# Patient Record
Sex: Female | Born: 1975 | Race: White | Hispanic: No | Smoking: Current every day smoker
Health system: Southern US, Community
[De-identification: ages and names within clinical notes are randomized; demographics above are authoritative.]

## PROBLEM LIST (undated history)

## (undated) DIAGNOSIS — R51 Headache: Secondary | ICD-10-CM

---

## 2002-07-29 ENCOUNTER — Emergency Department (HOSPITAL_COMMUNITY): Admission: EM | Admit: 2002-07-29 | Discharge: 2002-07-29 | Payer: Self-pay | Admitting: Unknown Physician Specialty

## 2002-07-29 ENCOUNTER — Encounter: Payer: Self-pay | Admitting: Emergency Medicine

## 2006-05-06 ENCOUNTER — Other Ambulatory Visit: Admission: RE | Admit: 2006-05-06 | Discharge: 2006-05-06 | Payer: Self-pay | Admitting: Family Medicine

## 2012-08-19 ENCOUNTER — Emergency Department (HOSPITAL_COMMUNITY)
Admission: EM | Admit: 2012-08-19 | Discharge: 2012-08-19 | Disposition: A | Discharge status: Home / Self Care | Payer: Medicaid Other | Attending: Emergency Medicine | Admitting: Emergency Medicine

## 2012-08-19 ENCOUNTER — Encounter (HOSPITAL_COMMUNITY): Payer: Self-pay | Admitting: *Deleted

## 2012-08-19 DIAGNOSIS — F17200 Nicotine dependence, unspecified, uncomplicated: Secondary | ICD-10-CM | POA: Insufficient documentation

## 2012-08-19 DIAGNOSIS — A088 Other specified intestinal infections: Secondary | ICD-10-CM | POA: Insufficient documentation

## 2012-08-19 DIAGNOSIS — R1110 Vomiting, unspecified: Secondary | ICD-10-CM | POA: Insufficient documentation

## 2012-08-19 DIAGNOSIS — Z3202 Encounter for pregnancy test, result negative: Secondary | ICD-10-CM | POA: Insufficient documentation

## 2012-08-19 DIAGNOSIS — IMO0002 Reserved for concepts with insufficient information to code with codable children: Secondary | ICD-10-CM | POA: Insufficient documentation

## 2012-08-19 DIAGNOSIS — R197 Diarrhea, unspecified: Secondary | ICD-10-CM | POA: Insufficient documentation

## 2012-08-19 DIAGNOSIS — Z79899 Other long term (current) drug therapy: Secondary | ICD-10-CM | POA: Insufficient documentation

## 2012-08-19 DIAGNOSIS — R1084 Generalized abdominal pain: Secondary | ICD-10-CM | POA: Insufficient documentation

## 2012-08-19 DIAGNOSIS — K529 Noninfective gastroenteritis and colitis, unspecified: Secondary | ICD-10-CM

## 2012-08-19 LAB — COMPREHENSIVE METABOLIC PANEL
ALT: 32 U/L (ref 0–35)
AST: 26 U/L (ref 0–37)
Albumin: 4.3 g/dL (ref 3.5–5.2)
Alkaline Phosphatase: 85 U/L (ref 39–117)
Calcium: 9.6 mg/dL (ref 8.4–10.5)
GFR calc Af Amer: >90 mL/min (ref >90)
Glucose, Bld: 114 mg/dL — ABNORMAL HIGH (ref 70–99)
Potassium: 4.2 mEq/L (ref 3.5–5.1)
Sodium: 137 mEq/L (ref 135–145)
Total Protein: 7.5 g/dL (ref 6.0–8.3)

## 2012-08-19 LAB — CBC
MCH: 30.6 pg (ref 26.0–34.0)
MCHC: 33.4 g/dL (ref 30.0–36.0)
Platelets: 437 10*3/uL — ABNORMAL HIGH (ref 150–400)
RDW: 13.3 % (ref 11.5–15.5)

## 2012-08-19 LAB — URINALYSIS, ROUTINE W REFLEX MICROSCOPIC
Bilirubin Urine: NEGATIVE
Glucose, UA: NEGATIVE mg/dL
Nitrite: NEGATIVE
Specific Gravity, Urine: 1.01 (ref 1.005–1.030)
pH: 7 (ref 5.0–8.0)

## 2012-08-19 LAB — URINE MICROSCOPIC-ADD ON

## 2012-08-19 MED ORDER — ONDANSETRON HCL 4 MG/2ML IJ SOLN
4.0000 mg | Freq: Once | INTRAMUSCULAR | Status: AC
  Administered 2012-08-19: 4 mg via INTRAVENOUS
  Filled 2012-08-19: qty 2

## 2012-08-19 MED ORDER — SODIUM CHLORIDE 0.9 % IV SOLN
Freq: Once | INTRAVENOUS | Status: AC
  Administered 2012-08-19: 21:00:00 via INTRAVENOUS

## 2012-08-19 MED ORDER — ONDANSETRON HCL 8 MG PO TABS: 8.0000 mg | ORAL_TABLET | Freq: Three times a day (TID) | ORAL | Status: DC | PRN

## 2012-08-19 MED ORDER — HYDROCODONE-ACETAMINOPHEN 5-325 MG PO TABS: 2.0000 | ORAL_TABLET | ORAL | Status: DC | PRN

## 2012-08-19 MED ORDER — HYDROMORPHONE HCL PF 1 MG/ML IJ SOLN
1.0000 mg | Freq: Once | INTRAMUSCULAR | Status: AC
  Administered 2012-08-19: 1 mg via INTRAVENOUS
  Filled 2012-08-19: qty 1

## 2012-08-19 MED ORDER — SODIUM CHLORIDE 0.9 % IV BOLUS (SEPSIS)
2000.0000 mL | Freq: Once | INTRAVENOUS | Status: AC
  Administered 2012-08-19: 1000 mL via INTRAVENOUS

## 2012-08-19 MED ORDER — ACETAMINOPHEN 325 MG PO TABS
650.0000 mg | ORAL_TABLET | Freq: Once | ORAL | Status: AC
  Administered 2012-08-19: 650 mg via ORAL
  Filled 2012-08-19: qty 2

## 2012-08-19 NOTE — ED Notes (Addendum)
Vomiting, fever, diarrhea  Took tylenol 1-2 hours pta  And motrin  Headache

## 2012-08-19 NOTE — ED Notes (Signed)
Pt verbalized understanding of need for urine sample to send to lab, states vomiting started this morning at 0500 and diarrhea later today with fever

## 2012-08-21 NOTE — ED Provider Notes (Signed)
History     CSN: [id number]  Arrival date & time 08/19/12  1625   First MD Initiated Contact with Patient 08/19/12 2028      Chief Complaint  Patient presents with  . Fever    (Consider location/radiation/quality/duration/timing/severity/associated sxs/prior treatment) HPI Comments: Jordan Dalton is a 37 y.o. Female who complains of vomiting and diarrhea today. Multiple episodes of each, without blood. She's had fever to 102. She denies headache, neck pain, back pain, weakness, or dizziness. She has crampy abdominal pain. Her pain worsens with attempts at oral intake. She came here by private vehicle. There are no other modifying factors.  Patient is a 37 y.o. female presenting with fever. The history is provided by the patient.  Fever   History reviewed. No pertinent past medical history.  Past Surgical History  Procedure Laterality Date  . Cesarean section      History reviewed. No pertinent family history.  History  Substance Use Topics  . Smoking status: Current Every Day Smoker  . Smokeless tobacco: Not on file  . Alcohol Use: Yes     Comment: rarely    OB History   Grav Para Term Preterm Abortions TAB SAB Ect Mult Living                  Review of Systems  Constitutional: Positive for fever.  All other systems reviewed and are negative.    Allergies  Darvocet and Tramadol  Home Medications   Current Outpatient Rx  Name  Route  Sig  Dispense  Refill  . acetaminophen (TYLENOL) 500 MG tablet   Oral   Take 1,000 mg by mouth daily as needed for pain.         Marland Kitchen ALPRAZolam (XANAX) 1 MG tablet   Oral   Take 1 mg by mouth 3 (three) times daily as needed for sleep or anxiety.         . Aspirin-Acetaminophen-Caffeine (GOODY HEADACHE PO)   Oral   Take 1 Package by mouth daily as needed (FOR PAIN).         . fluticasone (FLONASE) 50 MCG/ACT nasal spray   Nasal   Place 2 sprays into the nose daily.         Marland Kitchen ibuprofen (ADVIL,MOTRIN) 200 MG  tablet   Oral   Take 800 mg by mouth daily as needed for pain.         Marland Kitchen sertraline (ZOLOFT) 50 MG tablet   Oral   Take 50 mg by mouth daily.         Marland Kitchen HYDROcodone-acetaminophen (NORCO) 5-325 MG per tablet   Oral   Take 2 tablets by mouth every 4 (four) hours as needed for pain.   20 tablet   0   . ondansetron (ZOFRAN) 8 MG tablet   Oral   Take 1 tablet (8 mg total) by mouth every 8 (eight) hours as needed for nausea.   20 tablet   0     BP 107/70  Pulse 103  Temp(Src) 100.1 F (37.8 C) (Oral)  Resp 16  Ht 5\' 7"  (1.702 m)  Wt 105 lb (47.628 kg)  BMI 16.44 kg/m2  SpO2 98%  LMP 08/05/2012  Physical Exam  Nursing note and vitals reviewed. Constitutional: She is oriented to person, place, and time. She appears well-developed and well-nourished.  HENT:  Head: Normocephalic and atraumatic.  Eyes: Conjunctivae and EOM are normal. Pupils are equal, round, and reactive to light.  Neck: Normal range  of motion and phonation normal. Neck supple.  Cardiovascular: Normal rate, regular rhythm and intact distal pulses.   Pulmonary/Chest: Effort normal and breath sounds normal. She exhibits no tenderness.  Abdominal: Soft. Bowel sounds are normal. She exhibits no distension. There is tenderness (mild, diffuse). There is no rebound and no guarding.  Musculoskeletal: Normal range of motion.  Neurological: She is alert and oriented to person, place, and time. She has normal strength. She exhibits normal muscle tone.  Skin: Skin is warm and dry.  Psychiatric: She has a normal mood and affect. Her behavior is normal. Judgment and thought content normal.    ED Course  Procedures (including critical care time)   Medications  acetaminophen (TYLENOL) tablet 650 mg (650 mg Oral Given 08/19/12 2039)  0.9 %  sodium chloride infusion ( Intravenous Stopped 08/19/12 2138)  sodium chloride 0.9 % bolus 2,000 mL (0 mLs Intravenous Stopped 08/19/12 2219)  HYDROmorphone (DILAUDID) injection 1 mg  (1 mg Intravenous Given 08/19/12 2202)  ondansetron (ZOFRAN) injection 4 mg (4 mg Intravenous Given 08/19/12 2202)    Evaluation at discharge: She feels better.   Labs Reviewed  CBC - Abnormal; Notable for the following:    WBC 13.3 (*)    Platelets 437 (*)    All other components within normal limits  COMPREHENSIVE METABOLIC PANEL - Abnormal; Notable for the following:    Glucose, Bld 114 (*)    Total Bilirubin 0.2 (*)    All other components within normal limits  URINALYSIS, ROUTINE W REFLEX MICROSCOPIC - Abnormal; Notable for the following:    APPearance HAZY (*)    Hgb urine dipstick TRACE (*)    All other components within normal limits  URINE MICROSCOPIC-ADD ON - Abnormal; Notable for the following:    Squamous Epithelial / LPF FEW (*)    All other components within normal limits  PREGNANCY, URINE    Nursing Notes Reviewed/ Care Coordinated, and agree without changes. Applicable Imaging Reviewed Interpretation of Laboratory Data incorporated into ED treatment   1. Gastroenteritis       MDM  Evaluation consistent with simple viral gastroenteritis, epidemic. Doubt metabolic instability, serious bacterial infection or impending vascular collapse; the patient is stable for discharge.     Plan: Home Medications-  Discharge Medication List as of 08/19/2012 10:09 PM    START taking these medications   Details  HYDROcodone-acetaminophen (NORCO) 5-325 MG per tablet Take 2 tablets by mouth every 4 (four) hours as needed for pain., Starting 08/19/2012, Until Discontinued, Print    ondansetron (ZOFRAN) 8 MG tablet Take 1 tablet (8 mg total) by mouth every 8 (eight) hours as needed for nausea., Starting 08/19/2012, Until Discontinued, Print      ; Home Treatments- generally advanced diet; Recommended follow up- PCP, when necessary      Flint Melter, MD 08/21/12 [phone]

## 2012-12-05 ENCOUNTER — Emergency Department (HOSPITAL_COMMUNITY)
Admission: EM | Admit: 2012-12-05 | Discharge: 2012-12-05 | Discharge status: Against Medical Advice | Payer: Self-pay | Attending: Emergency Medicine | Admitting: Emergency Medicine

## 2012-12-05 ENCOUNTER — Encounter (HOSPITAL_COMMUNITY): Payer: Self-pay | Admitting: *Deleted

## 2012-12-05 DIAGNOSIS — F17200 Nicotine dependence, unspecified, uncomplicated: Secondary | ICD-10-CM | POA: Insufficient documentation

## 2012-12-05 DIAGNOSIS — Z3202 Encounter for pregnancy test, result negative: Secondary | ICD-10-CM | POA: Insufficient documentation

## 2012-12-05 DIAGNOSIS — N939 Abnormal uterine and vaginal bleeding, unspecified: Secondary | ICD-10-CM | POA: Insufficient documentation

## 2012-12-05 DIAGNOSIS — R1032 Left lower quadrant pain: Secondary | ICD-10-CM | POA: Insufficient documentation

## 2012-12-05 DIAGNOSIS — N926 Irregular menstruation, unspecified: Secondary | ICD-10-CM | POA: Insufficient documentation

## 2012-12-05 LAB — URINALYSIS, ROUTINE W REFLEX MICROSCOPIC
Bilirubin Urine: NEGATIVE
Hgb urine dipstick: NEGATIVE
Nitrite: NEGATIVE
Protein, ur: NEGATIVE mg/dL
Urobilinogen, UA: 0.2 mg/dL (ref 0.0–1.0)

## 2012-12-05 NOTE — ED Notes (Signed)
Abnormal vaginal bleeding x 4 wks with LLQ pain.  Took 3 home pregnancy tests between May 15 and June 1 with positive results.  Took home pregnancy test 2 wks ago with negative result.  Denies n/v/d.

## 2012-12-05 NOTE — ED Notes (Signed)
Pt called to treatment area twice. No response. Pt not in lobby

## 2012-12-17 ENCOUNTER — Telehealth: Payer: Self-pay | Admitting: *Deleted

## 2012-12-31 NOTE — Telephone Encounter (Signed)
Left message x 2. JSY 

## 2013-05-27 ENCOUNTER — Other Ambulatory Visit: Payer: Self-pay | Admitting: Obstetrics & Gynecology

## 2013-05-27 DIAGNOSIS — O3680X0 Pregnancy with inconclusive fetal viability, not applicable or unspecified: Secondary | ICD-10-CM

## 2013-05-28 NOTE — L&D Delivery Note (Signed)
Pt was admitted for a TOLAC last Pm in early labor. After admission she had an epidural. She completed the first stage with occ variable decels. She had a 7 min decel with pushing. She continued to have deep decels and the forceps were placed in the LOA position at +2  Station to shorten the second stage. She delivered one live viable white female with the first contraction. She had thick mec. Placenta-Manually removed. She had a 2nd degree midline tear and bilateral labial tears closed with 3-0 chromic. EBL-400cc. Baby to NBN. NICU was present.

## 2013-05-29 ENCOUNTER — Other Ambulatory Visit: Payer: Self-pay

## 2013-05-29 ENCOUNTER — Encounter: Payer: Self-pay | Admitting: *Deleted

## 2013-06-29 LAB — OB RESULTS CONSOLE ABO/RH: RH Type: POSITIVE

## 2013-06-29 LAB — OB RESULTS CONSOLE GC/CHLAMYDIA
Chlamydia: NEGATIVE
GC PROBE AMP, GENITAL: NEGATIVE

## 2013-06-29 LAB — OB RESULTS CONSOLE RPR: RPR: NONREACTIVE

## 2013-06-29 LAB — OB RESULTS CONSOLE ANTIBODY SCREEN: Antibody Screen: NEGATIVE

## 2013-06-29 LAB — OB RESULTS CONSOLE HIV ANTIBODY (ROUTINE TESTING): HIV: NONREACTIVE

## 2013-06-29 LAB — OB RESULTS CONSOLE HEPATITIS B SURFACE ANTIGEN: Hepatitis B Surface Ag: NEGATIVE

## 2013-06-29 LAB — OB RESULTS CONSOLE RUBELLA ANTIBODY, IGM: Rubella: IMMUNE

## 2013-09-12 LAB — OB RESULTS CONSOLE GBS: STREP GROUP B AG: NEGATIVE

## 2013-09-15 ENCOUNTER — Other Ambulatory Visit: Payer: Self-pay

## 2013-09-15 ENCOUNTER — Other Ambulatory Visit (HOSPITAL_COMMUNITY): Payer: Self-pay | Admitting: Obstetrics and Gynecology

## 2013-09-15 ENCOUNTER — Other Ambulatory Visit: Payer: Self-pay | Admitting: Obstetrics & Gynecology

## 2013-09-15 DIAGNOSIS — IMO0002 Reserved for concepts with insufficient information to code with codable children: Secondary | ICD-10-CM

## 2013-09-15 DIAGNOSIS — O3680X0 Pregnancy with inconclusive fetal viability, not applicable or unspecified: Secondary | ICD-10-CM

## 2013-09-17 ENCOUNTER — Ambulatory Visit (HOSPITAL_COMMUNITY)
Admission: RE | Admit: 2013-09-17 | Discharge: 2013-09-17 | Disposition: A | Discharge status: Home / Self Care | Payer: Medicaid Other | Source: Ambulatory Visit | Attending: Obstetrics and Gynecology | Admitting: Obstetrics and Gynecology

## 2013-09-17 ENCOUNTER — Encounter (HOSPITAL_COMMUNITY): Payer: Self-pay

## 2013-09-17 DIAGNOSIS — O365990 Maternal care for other known or suspected poor fetal growth, unspecified trimester, not applicable or unspecified: Secondary | ICD-10-CM | POA: Insufficient documentation

## 2013-09-17 DIAGNOSIS — IMO0002 Reserved for concepts with insufficient information to code with codable children: Secondary | ICD-10-CM

## 2013-09-17 DIAGNOSIS — Z3689 Encounter for other specified antenatal screening: Secondary | ICD-10-CM | POA: Insufficient documentation

## 2013-09-21 ENCOUNTER — Encounter (HOSPITAL_COMMUNITY): Payer: Self-pay | Admitting: Pharmacist

## 2013-09-26 ENCOUNTER — Inpatient Hospital Stay (HOSPITAL_COMMUNITY)
Admission: AD | Admit: 2013-09-26 | Discharge: 2013-09-26 | Disposition: A | Discharge status: Home / Self Care | Payer: Medicaid Other | Source: Ambulatory Visit | Attending: Obstetrics and Gynecology | Admitting: Obstetrics and Gynecology

## 2013-09-26 ENCOUNTER — Inpatient Hospital Stay (HOSPITAL_COMMUNITY): Payer: Medicaid Other | Admitting: Anesthesiology

## 2013-09-26 ENCOUNTER — Inpatient Hospital Stay (HOSPITAL_COMMUNITY)
Admission: AD | Admit: 2013-09-26 | Discharge: 2013-09-30 | DRG: 774 | Disposition: A | Discharge status: Home / Self Care | Payer: Medicaid Other | Source: Ambulatory Visit | Attending: Obstetrics and Gynecology | Admitting: Obstetrics and Gynecology

## 2013-09-26 ENCOUNTER — Encounter (HOSPITAL_COMMUNITY): Payer: Self-pay | Admitting: Family

## 2013-09-26 ENCOUNTER — Encounter (HOSPITAL_COMMUNITY): Payer: Medicaid Other | Admitting: Anesthesiology

## 2013-09-26 ENCOUNTER — Encounter (HOSPITAL_COMMUNITY): Payer: Self-pay

## 2013-09-26 DIAGNOSIS — O34219 Maternal care for unspecified type scar from previous cesarean delivery: Secondary | ICD-10-CM | POA: Diagnosis present

## 2013-09-26 DIAGNOSIS — O479 False labor, unspecified: Secondary | ICD-10-CM | POA: Insufficient documentation

## 2013-09-26 DIAGNOSIS — O1002 Pre-existing essential hypertension complicating childbirth: Secondary | ICD-10-CM | POA: Diagnosis present

## 2013-09-26 DIAGNOSIS — O99334 Smoking (tobacco) complicating childbirth: Secondary | ICD-10-CM | POA: Diagnosis present

## 2013-09-26 DIAGNOSIS — Z3480 Encounter for supervision of other normal pregnancy, unspecified trimester: Secondary | ICD-10-CM

## 2013-09-26 HISTORY — DX: Headache: R51

## 2013-09-26 LAB — TYPE AND SCREEN
ABO/RH(D): O POS
Antibody Screen: NEGATIVE

## 2013-09-26 LAB — CBC
HCT: 36 % (ref 36.0–46.0)
Hemoglobin: 12.7 g/dL (ref 12.0–15.0)
MCH: 30.7 pg (ref 26.0–34.0)
MCHC: 35.3 g/dL (ref 30.0–36.0)
MCV: 87 fL (ref 78.0–100.0)
PLATELETS: 251 10*3/uL (ref 150–400)
RBC: 4.14 MIL/uL (ref 3.87–5.11)
RDW: 13.7 % (ref 11.5–15.5)
WBC: 14.1 10*3/uL — ABNORMAL HIGH (ref 4.0–10.5)

## 2013-09-26 LAB — ABO/RH: ABO/RH(D): O POS

## 2013-09-26 MED ORDER — FLEET ENEMA 7-19 GM/118ML RE ENEM: 1.0000 | ENEMA | RECTAL | Status: DC | PRN

## 2013-09-26 MED ORDER — PHENYLEPHRINE 40 MCG/ML (10ML) SYRINGE FOR IV PUSH (FOR BLOOD PRESSURE SUPPORT)
80.0000 ug | PREFILLED_SYRINGE | INTRAVENOUS | Status: DC | PRN
  Filled 2013-09-26: qty 2

## 2013-09-26 MED ORDER — DIPHENHYDRAMINE HCL 50 MG/ML IJ SOLN: 12.5000 mg | INTRAMUSCULAR | Status: DC | PRN

## 2013-09-26 MED ORDER — LIDOCAINE HCL (PF) 1 % IJ SOLN
30.0000 mL | INTRAMUSCULAR | Status: DC | PRN
  Filled 2013-09-26: qty 30

## 2013-09-26 MED ORDER — FENTANYL 2.5 MCG/ML BUPIVACAINE 1/10 % EPIDURAL INFUSION (WH - ANES)
14.0000 mL/h | INTRAMUSCULAR | Status: DC | PRN
  Administered 2013-09-26 – 2013-09-27 (×2): 14 mL/h via EPIDURAL
  Filled 2013-09-26 (×2): qty 125

## 2013-09-26 MED ORDER — PHENYLEPHRINE 40 MCG/ML (10ML) SYRINGE FOR IV PUSH (FOR BLOOD PRESSURE SUPPORT)
80.0000 ug | PREFILLED_SYRINGE | INTRAVENOUS | Status: DC | PRN
  Filled 2013-09-26: qty 2
  Filled 2013-09-26: qty 10

## 2013-09-26 MED ORDER — EPHEDRINE 5 MG/ML INJ
10.0000 mg | INTRAVENOUS | Status: DC | PRN
  Filled 2013-09-26: qty 2
  Filled 2013-09-26: qty 4

## 2013-09-26 MED ORDER — OXYTOCIN BOLUS FROM INFUSION: 500.0000 mL | INTRAVENOUS | Status: DC

## 2013-09-26 MED ORDER — LACTATED RINGERS IV SOLN
INTRAVENOUS | Status: DC
  Administered 2013-09-26 – 2013-09-27 (×2): via INTRAVENOUS

## 2013-09-26 MED ORDER — CITRIC ACID-SODIUM CITRATE 334-500 MG/5ML PO SOLN: 30.0000 mL | ORAL | Status: DC | PRN

## 2013-09-26 MED ORDER — EPHEDRINE 5 MG/ML INJ
10.0000 mg | INTRAVENOUS | Status: DC | PRN
  Filled 2013-09-26: qty 2

## 2013-09-26 MED ORDER — LACTATED RINGERS IV SOLN
500.0000 mL | INTRAVENOUS | Status: DC | PRN
  Administered 2013-09-26: 500 mL via INTRAVENOUS

## 2013-09-26 MED ORDER — IBUPROFEN 600 MG PO TABS: 600.0000 mg | ORAL_TABLET | Freq: Four times a day (QID) | ORAL | Status: DC | PRN

## 2013-09-26 MED ORDER — LACTATED RINGERS IV SOLN
500.0000 mL | Freq: Once | INTRAVENOUS | Status: AC
  Administered 2013-09-26: 500 mL via INTRAVENOUS

## 2013-09-26 MED ORDER — ONDANSETRON HCL 4 MG/2ML IJ SOLN: 4.0000 mg | Freq: Four times a day (QID) | INTRAMUSCULAR | Status: DC | PRN

## 2013-09-26 MED ORDER — LIDOCAINE HCL (PF) 1 % IJ SOLN
INTRAMUSCULAR | Status: DC | PRN
  Administered 2013-09-26 (×2): 5 mL

## 2013-09-26 MED ORDER — ACETAMINOPHEN 325 MG PO TABS
650.0000 mg | ORAL_TABLET | ORAL | Status: DC | PRN
  Administered 2013-09-27: 650 mg via ORAL
  Filled 2013-09-26: qty 2

## 2013-09-26 MED ORDER — OXYTOCIN 40 UNITS IN LACTATED RINGERS INFUSION - SIMPLE MED
62.5000 mL/h | INTRAVENOUS | Status: DC
  Filled 2013-09-26: qty 1000

## 2013-09-26 NOTE — MAU Note (Signed)
Contractions continued.  Was 2 cm earlier.

## 2013-09-26 NOTE — H&P (Signed)
Pt is a 38 y/o white female G3P2002 at term who presents c/o contractions. She has a h.o. Prior C/S for breech with her last preg. [facility] and has [facility]. She also has had a prior vaginal birth with her first preg. She is contracting q 3-4 min and is 4 cm. PMHX- see holister PE: VSSAF         HEENT- wnl         ABD- gravid, palp contraction         FHTs- reactive,  IMP/ IUP at term,          H.O. C/S         Desires VBAC PLAN/ Admit

## 2013-09-26 NOTE — Anesthesia Procedure Notes (Signed)
Epidural Patient location during procedure: OB Start time: 09/26/2013 10:18 PM  Staffing Anesthesiologist: Brayton CavesJACKSON, Emanuele Mcwhirter Performed by: anesthesiologist   Preanesthetic Checklist Completed: patient identified, site marked, surgical consent, pre-op evaluation, timeout performed, IV checked, risks and benefits discussed and monitors and equipment checked  Epidural Patient position: sitting Prep: site prepped and draped and DuraPrep Patient monitoring: continuous pulse ox and blood pressure Approach: midline Location: L3-L4 Injection technique: LOR air  Needle:  Needle type: Tuohy  Needle gauge: 17 G Needle length: 9 cm and 9 Needle insertion depth: 5 cm cm Catheter type: closed end flexible Catheter size: 19 Gauge Catheter at skin depth: 10 cm Test dose: negative  Assessment Events: blood not aspirated, injection not painful, no injection resistance, negative IV test and no paresthesia  Additional Notes Patient identified.  Risk benefits discussed including failed block, incomplete pain control, headache, nerve damage, paralysis, blood pressure changes, nausea, vomiting, reactions to medication both toxic or allergic, and postpartum back pain.  Patient expressed understanding and wished to proceed.  All questions were answered.  Sterile technique used throughout procedure and epidural site dressed with sterile barrier dressing. No paresthesia or other complications noted.The patient did not experience any signs of intravascular injection such as tinnitus or metallic taste in mouth nor signs of intrathecal spread such as rapid motor block. Please see nursing notes for vital signs.

## 2013-09-26 NOTE — MAU Note (Addendum)
38 yo, G3P2 at 6628w5d, presents to MAU with c/o contractions every 3 minutes x 45 minutes. Denies VB, LOF. Reports +FM.  Desires TOLAC.

## 2013-09-26 NOTE — Anesthesia Preprocedure Evaluation (Addendum)
Anesthesia Evaluation  Patient identified by MRN, date of birth, ID band Patient awake    Reviewed: Allergy & Precautions, H&P , Patient's Chart, lab work & pertinent test results  Airway Mallampati: II TM Distance: >3 FB Neck ROM: full    Dental   Pulmonary Current Smoker,  breath sounds clear to auscultation        Cardiovascular hypertension, Rhythm:regular Rate:Normal     Neuro/Psych  Headaches,    GI/Hepatic   Endo/Other    Renal/GU      Musculoskeletal   Abdominal   Peds  Hematology   Anesthesia Other Findings   Reproductive/Obstetrics (+) Pregnancy                          Anesthesia Physical Anesthesia Plan  ASA: III  Anesthesia Plan: Epidural   Post-op Pain Management:    Induction:   Airway Management Planned:   Additional Equipment:   Intra-op Plan:   Post-operative Plan:   Informed Consent: I have reviewed the patients History and Physical, chart, labs and discussed the procedure including the risks, benefits and alternatives for the proposed anesthesia with the patient or authorized representative who has indicated his/her understanding and acceptance.     Plan Discussed with:   Anesthesia Plan Comments:        Anesthesia Quick Evaluation

## 2013-09-27 ENCOUNTER — Encounter (HOSPITAL_COMMUNITY): Payer: Self-pay | Admitting: *Deleted

## 2013-09-27 DIAGNOSIS — Z3480 Encounter for supervision of other normal pregnancy, unspecified trimester: Secondary | ICD-10-CM

## 2013-09-27 LAB — CBC
HEMATOCRIT: 33.7 % — AB (ref 36.0–46.0)
Hemoglobin: 11.5 g/dL — ABNORMAL LOW (ref 12.0–15.0)
MCH: 30.5 pg (ref 26.0–34.0)
MCHC: 34.1 g/dL (ref 30.0–36.0)
MCV: 89.4 fL (ref 78.0–100.0)
Platelets: 201 10*3/uL (ref 150–400)
RBC: 3.77 MIL/uL — AB (ref 3.87–5.11)
RDW: 13.8 % (ref 11.5–15.5)
WBC: 13.6 10*3/uL — AB (ref 4.0–10.5)

## 2013-09-27 MED ORDER — SENNOSIDES-DOCUSATE SODIUM 8.6-50 MG PO TABS
2.0000 | ORAL_TABLET | ORAL | Status: DC
  Administered 2013-09-27 – 2013-09-30 (×3): 2 via ORAL
  Filled 2013-09-27 (×3): qty 2

## 2013-09-27 MED ORDER — WITCH HAZEL-GLYCERIN EX PADS: 1.0000 "application " | MEDICATED_PAD | CUTANEOUS | Status: DC | PRN

## 2013-09-27 MED ORDER — OXYCODONE-ACETAMINOPHEN 5-325 MG PO TABS
1.0000 | ORAL_TABLET | ORAL | Status: DC | PRN
  Administered 2013-09-27 (×4): 2 via ORAL
  Administered 2013-09-28: 1 via ORAL
  Administered 2013-09-28 – 2013-09-29 (×8): 2 via ORAL
  Administered 2013-09-29: 1 via ORAL
  Administered 2013-09-29 – 2013-09-30 (×3): 2 via ORAL
  Filled 2013-09-27 (×4): qty 2
  Filled 2013-09-27: qty 1
  Filled 2013-09-27 (×12): qty 2

## 2013-09-27 MED ORDER — BENZOCAINE-MENTHOL 20-0.5 % EX AERO
1.0000 "application " | INHALATION_SPRAY | CUTANEOUS | Status: DC | PRN
  Administered 2013-09-27: 1 via TOPICAL
  Filled 2013-09-27 (×3): qty 56

## 2013-09-27 MED ORDER — ONDANSETRON HCL 4 MG/2ML IJ SOLN: 4.0000 mg | INTRAMUSCULAR | Status: DC | PRN

## 2013-09-27 MED ORDER — LABETALOL HCL 200 MG PO TABS
200.0000 mg | ORAL_TABLET | Freq: Once | ORAL | Status: AC
  Administered 2013-09-27: 200 mg via ORAL
  Filled 2013-09-27: qty 1

## 2013-09-27 MED ORDER — DIBUCAINE 1 % RE OINT: 1.0000 "application " | TOPICAL_OINTMENT | RECTAL | Status: DC | PRN

## 2013-09-27 MED ORDER — IBUPROFEN 600 MG PO TABS
600.0000 mg | ORAL_TABLET | Freq: Four times a day (QID) | ORAL | Status: DC
  Administered 2013-09-27 – 2013-09-30 (×13): 600 mg via ORAL
  Filled 2013-09-27 (×13): qty 1

## 2013-09-27 MED ORDER — MEASLES, MUMPS & RUBELLA VAC ~~LOC~~ INJ
0.5000 mL | INJECTION | Freq: Once | SUBCUTANEOUS | Status: DC
  Filled 2013-09-27: qty 0.5

## 2013-09-27 MED ORDER — TERBUTALINE SULFATE 1 MG/ML IJ SOLN: 0.2500 mg | Freq: Once | INTRAMUSCULAR | Status: DC | PRN

## 2013-09-27 MED ORDER — SIMETHICONE 80 MG PO CHEW
80.0000 mg | CHEWABLE_TABLET | ORAL | Status: DC | PRN
Start: 2013-09-27 — End: 2013-09-30
  Administered 2013-09-28: 80 mg via ORAL
  Filled 2013-09-27: qty 1

## 2013-09-27 MED ORDER — ZOLPIDEM TARTRATE 5 MG PO TABS: 5.0000 mg | ORAL_TABLET | Freq: Every evening | ORAL | Status: DC | PRN

## 2013-09-27 MED ORDER — TETANUS-DIPHTH-ACELL PERTUSSIS 5-2.5-18.5 LF-MCG/0.5 IM SUSP: 0.5000 mL | Freq: Once | INTRAMUSCULAR | Status: DC

## 2013-09-27 MED ORDER — OXYTOCIN 40 UNITS IN LACTATED RINGERS INFUSION - SIMPLE MED
1.0000 m[IU]/min | INTRAVENOUS | Status: DC
  Administered 2013-09-27: 1 m[IU]/min via INTRAVENOUS

## 2013-09-27 MED ORDER — ONDANSETRON HCL 4 MG PO TABS: 4.0000 mg | ORAL_TABLET | ORAL | Status: DC | PRN

## 2013-09-27 NOTE — Consult Note (Signed)
Neonatology Note:   Attendance at Delivery:    I was asked by Dr. Dareen PianoAnderson to attend this forceps-assisted vaginal at term. The mother is a G3P2 O pos, GBS not found with FHR decelerations and thick meconium-stained fluid. ROM 2 hours prior to delivery, fluid with thick meconium. Infant vigorous with good spontaneous cry and tone. Bulb suctioned by Dr. Dareen PianoAnderson on the perineum prior to first crying. Needed only minimal bulb suctioning for green fluid. Ap 8/9. Lungs clear to ausc in DR. To CN to care of Pediatrician.   Doretha Souhristie C. Angalena Cousineau, MD

## 2013-09-28 ENCOUNTER — Encounter (HOSPITAL_COMMUNITY): Payer: Self-pay | Admitting: Registered Nurse

## 2013-09-28 LAB — CBC
HCT: 28.9 % — ABNORMAL LOW (ref 36.0–46.0)
HEMOGLOBIN: 9.9 g/dL — AB (ref 12.0–15.0)
MCH: 30.3 pg (ref 26.0–34.0)
MCHC: 34.3 g/dL (ref 30.0–36.0)
MCV: 88.4 fL (ref 78.0–100.0)
Platelets: 179 10*3/uL (ref 150–400)
RBC: 3.27 MIL/uL — ABNORMAL LOW (ref 3.87–5.11)
RDW: 13.8 % (ref 11.5–15.5)
WBC: 13.1 10*3/uL — ABNORMAL HIGH (ref 4.0–10.5)

## 2013-09-28 LAB — RPR

## 2013-09-28 NOTE — Lactation Note (Signed)
This note was copied from the chart of Jordan Dalton. Lactation Consultation Note Follow up consult: Baby has primarily been formula fed.  Mother able to hand express drop of colostrum.  Hand pumped a few drops. Baby very fussy, shaky and difficult to console.  Would not suck finger, only biting. Attempted to latch baby and she would not latch with drops of colostrum in both fb and cross cradle. FOB states baby has not slept in a while due to disruptions so mother placed baby STS and will follow up to assist with latch.   Patient Name: Jordan Gypsy Loreracey Cange ZOXWR'UToday's Date: 09/28/2013 Reason for consult: Follow-up assessment   Maternal Data    Feeding Feeding Type: Bottle Fed - Formula Nipple Type: Slow - flow  LATCH Score/Interventions                      Lactation Tools Discussed/Used     Consult Status Consult Status: Follow-up Date: 09/29/13 Follow-up type: In-patient    Dulce SellarRuth Boschen Leathia Farnell 09/28/2013, 12:16 PM

## 2013-09-28 NOTE — Progress Notes (Signed)
Mom has concerned about problems with feeding, due to increased fussiness. Infant needs lots of encouragement and staff assists with  breast compression and latch with a fair result. Infant won't sustain a latch for more than 2-375min. Mother says she used a SNS system with prior breastfeeding experience. Discussed situation with Lactation nurse, and she supports giving a SNS to achieve better, sustained feedings. Instructed mother on equipment, use, and cleaning. Declines to use at this time, visitors here to hold infant.

## 2013-09-28 NOTE — Progress Notes (Signed)
Ur chart review completed.  

## 2013-09-28 NOTE — Lactation Note (Signed)
This note was copied from the chart of Jordan Dalton Hansmann. Lactation Consultation Note Noted mom had given lots of bottles per her request. Stated baby was tired and she was tired and she would BF more during the day and bottle feed more at night. Mom didn't appear interested in talking to me about BF. Mom made aware of O/P services, breastfeeding support groups, community resources, and our phone # for post-discharge questions. Mom encouraged to feed baby w/feeding cuesMother informed of post-discharge support and given phone number to the lactation department, including services for phone call assistance; out-patient appointments; and breastfeeding support group. List of other breastfeeding resources in the community given in the handout. Encouraged mother to call for problems or concerns related to breastfeeding.Encouraged to call for assistance if needed and to verify proper latch.  Patient Name: Jordan Dalton Patrie ZOXWR'UToday's Date: 09/28/2013     Maternal Data    Feeding Feeding Type: Formula Nipple Type: Slow - flow  LATCH Score/Interventions                      Lactation Tools Discussed/Used     Consult Status      Charyl DancerLaura G Statia Burdick 09/28/2013, 6:02 AM

## 2013-09-28 NOTE — Progress Notes (Signed)
Post Partum Day 1 s/p Forceps assisted vaginal delivery Subjective: up ad lib, voiding, tolerating PO, + flatus and  patient sore in the perineum  Objective: Blood pressure 143/64, pulse 65, temperature 98.2 F (36.8 C), temperature source Oral, resp. rate 18, height 5\' 7"  (1.702 m), weight 62.596 kg (138 lb), last menstrual period 12/29/2012, SpO2 97.00%, unknown if currently breastfeeding.  Physical Exam:  General: alert, cooperative and no distress Lochia: appropriate Uterine Fundus: firm Perineum: healing well, no significant drainage, no dehiscence DVT Evaluation: No evidence of DVT seen on physical exam. Negative Homan's sign. No cords or calf tenderness. Calf/Ankle edema 2+ is present.   Recent Labs  09/27/13 1000 09/28/13 0555  HGB 11.5* 9.9*  HCT 33.7* 28.9*    Assessment/Plan: Plan for discharge tomorrow and Breastfeeding   LOS: 2 days   Greggory Safranek 09/28/2013, 12:03 PM

## 2013-09-28 NOTE — Anesthesia Postprocedure Evaluation (Signed)
Anesthesia Post Note  Patient: Jordan Dalton  Procedure(s) Performed: * No procedures listed *  Anesthesia type: Epidural  Patient location: Mother/Baby  Post pain: Pain level controlled  Post assessment: Post-op Vital signs reviewed  Last Vitals:  Filed Vitals:   09/28/13 1010  BP: 143/64  Pulse: 65  Temp: 36.8 C  Resp: 18    Post vital signs: Reviewed  Level of consciousness: awake  Complications: No apparent anesthesia complications

## 2013-09-29 ENCOUNTER — Encounter (HOSPITAL_COMMUNITY): Payer: Self-pay | Admitting: *Deleted

## 2013-09-29 MED ORDER — LABETALOL HCL 200 MG PO TABS
200.0000 mg | ORAL_TABLET | Freq: Two times a day (BID) | ORAL | Status: DC
  Administered 2013-09-29 – 2013-09-30 (×3): 200 mg via ORAL
  Filled 2013-09-29 (×5): qty 1

## 2013-09-29 NOTE — Lactation Note (Signed)
This note was copied from the chart of Jordan Gypsy Loreracey Vallone. Lactation Consultation Note  Patient Name: Jordan Dalton ZOXWR'UToday's Date: 09/29/2013 Reason for consult: Follow-up assessment Set Mom up with DEBP for pump every 3 hours for 15 minutes on preemie setting. NICU booklet given to Mom. Storage guidelines and cleaning pump parts reviewed with Mom.   Maternal Data    Feeding Feeding Type: Formula Nipple Type: Slow - flow Length of feed: 20 min  LATCH Score/Interventions                      Lactation Tools Discussed/Used Tools: Pump Breast pump type: Double-Electric Breast Pump Pump Review: Setup, frequency, and cleaning;Milk Storage Initiated by:: KG Date initiated:: 09/29/13   Consult Status Consult Status: Follow-up Date: 09/30/13 Follow-up type: In-patient    Kearney HardKathy Ann Arliss Hepburn 09/29/2013, 3:52 PM

## 2013-09-29 NOTE — Discharge Summary (Signed)
Obstetric Discharge Summary Reason for Admission: onset of labor Prenatal Procedures: NST and ultrasound Intrapartum Procedures: forceps (low) Postpartum Procedures: none Complications-Operative and Postpartum: bilateral labial laceration Hemoglobin  Date Value Ref Range Status  09/28/2013 9.9* 12.0 - 15.0 g/dL Final     HCT  Date Value Ref Range Status  09/28/2013 28.9* 36.0 - 46.0 % Final    Physical Exam:  General: alert, cooperative and appears stated age 2Lochia: appropriate Uterine Fundus: firm   Discharge Diagnoses: Term Pregnancy-delivered  Discharge Information: Date: 09/29/2013 Activity: pelvic rest Diet: routine Medications: Ibuprofen, Colace and Percocet Condition: improved Instructions: refer to practice specific booklet Discharge to: home Follow-up Information   Follow up with Levi AlandANDERSON,MARK E, MD. (For a postpartum evaluation)    Specialty:  Obstetrics and Gynecology   Contact information:   [address] [place] [phone]       Newborn Data: Live born female  Birth Weight: 5 lb 4.8 oz (2404 g) APGAR: 8, 9  Home with mother.  Freddrick MarchKendra H. Guinn Delarosa 09/29/2013, 8:40 AM

## 2013-09-29 NOTE — Progress Notes (Signed)
Post Partum Day 2 Subjective: Pt states bottom still hurts. BPs still elevated. Baby taken to NICU d/t jitteriness & inconsolability  Objective: Filed Vitals:   09/28/13 0537 09/28/13 1010 09/28/13 1723 09/29/13 0555  BP: 145/87 143/64 154/85 160/89  Pulse: 69 65 89 56  Temp: 98.2 F (36.8 C) 98.2 F (36.8 C) 98.1 F (36.7 C) 98.3 F (36.8 C)  TempSrc: Oral Oral Oral Oral  Resp: 17 18 20 18   Height:      Weight:      SpO2: 95% 97%      Physical Exam:  General: alert, cooperative and appears stated age Lochia: appropriate Uterine Fundus: firm   Recent Labs  09/27/13 1000 09/28/13 0555  HGB 11.5* 9.9*  HCT 33.7* 28.9*    Assessment/Plan: 1) BPs elevated still. Pt not on antihypertensive meds. Will start labetalol 200mg  BID   LOS: 3 days   Dahir Ayer H. Kruz Chiu 09/29/2013, 8:57 AM

## 2013-09-29 NOTE — Anesthesia Postprocedure Evaluation (Signed)
  Anesthesia Post Note  Patient: Jordan Dalton  Procedure(s) Performed: * No procedures listed *  Anesthesia type: Epidural  Patient location: Mother/Baby  Post pain: Pain level controlled  Post assessment: Post-op Vital signs reviewed  Last Vitals:  Filed Vitals:   09/29/13 1040  BP: 122/81  Pulse: 83  Temp:   Resp:     Post vital signs: Reviewed  Level of consciousness: awake  Complications: No apparent anesthesia complications  -- charted under C-section, but patient decided to proceed with vaginal delivery

## 2013-09-29 NOTE — Lactation Note (Signed)
This note was copied from the chart of Jordan Gypsy Loreracey Koroma. Lactation Consultation Note  Patient Name: Jordan Dalton EAVWU'JToday's Date: 09/29/2013 Reason for consult: Follow-up assessment;NICU baby Pecola LeisureBaby has been transferred to NICU. Discussed pumping for Mom to provide EBM. Mom agreed to pump. LC discussed importance of pumping every 3 hours for 15 minutes to encourage milk production. Mom does not have WIC per her report [place] [facility] and obtaining breast pump for d/c tomorrow. Returned to demonstrate pump to Mom but she was not available. Will have RN initiate pumping when Mom available.   Maternal Data    Feeding    LATCH Score/Interventions                      Lactation Tools Discussed/Used Tools: Pump Breast pump type: Double-Electric Breast Pump Date initiated:: 09/29/13   Consult Status Consult Status: Follow-up Date: 09/29/13 Follow-up type: In-patient    [name] 09/29/2013, 10:05 AM

## 2013-09-29 NOTE — Lactation Note (Signed)
This note was copied from the chart of Jordan Gypsy Loreracey Colborn. Lactation Consultation Note    Follow up consult with this mom in NICU. She had just began pumping, since her baby was in the nICU with NAS - mom reports from cigarette smoking only. She expressed about 1 mls of colostrum, I reviewed hand expression with her, and advisedherr that if she continued to smoke, to do so after pumping, so as not to  decrease her letdown. She said that was what she was doing. She said she breast fed her first child for about 6 months, and her second for less, due to having to return to work.  Mom knows to call for questions/concerns  Patient Name: Jordan Dalton ZOXWR'UToday's Date: 09/29/2013 Reason for consult: Follow-up assessment   Maternal Data    Feeding    LATCH Score/Interventions                      Lactation Tools Discussed/Used Tools: Pump Breast pump type: Double-Electric Breast Pump Pump Review: Setup, frequency, and cleaning;Milk Storage Initiated by:: KG Date initiated:: 09/29/13   Consult Status Consult Status: Follow-up Date: 09/30/13 Follow-up type: In-patient    Alfred LevinsChristine Anne Eddis Pingleton 09/29/2013, 6:50 PM

## 2013-09-30 ENCOUNTER — Other Ambulatory Visit (HOSPITAL_COMMUNITY): Payer: Medicaid Other

## 2013-09-30 MED ORDER — TETANUS-DIPHTH-ACELL PERTUSSIS 5-2.5-18.5 LF-MCG/0.5 IM SUSP
0.5000 mL | Freq: Once | INTRAMUSCULAR | Status: AC
  Administered 2013-09-30: 0.5 mL via INTRAMUSCULAR

## 2013-09-30 NOTE — Discharge Summary (Signed)
Obstetric Discharge Summary Reason for Admission: onset of labor Prenatal Procedures: ultrasound Intrapartum Procedures: forceps (low) Postpartum Procedures: none Complications-Operative and Postpartum: 2nd degree perineal laceration Hemoglobin  Date Value Ref Range Status  09/28/2013 9.9* 12.0 - 15.0 g/dL Final     HCT  Date Value Ref Range Status  09/28/2013 28.9* 36.0 - 46.0 % Final    Physical Exam:  General: alert and cooperative Lochia: appropriate Uterine Fundus: firm DVT Evaluation: No evidence of DVT seen on physical exam.  Discharge Diagnoses: Term Pregnancy-delivered  Discharge Information: Date: 09/30/2013 Activity: pelvic rest Diet: routine Medications: PNV and Ibuprofen Condition: stable Instructions: refer to practice specific booklet Discharge to: home Follow-up Information   Follow up with Levi AlandANDERSON,MARK E, MD. (For a postpartum evaluation)    Specialty:  Obstetrics and Gynecology   Contact information:   [address] [place] [id number] [phone]       Follow up with Levi AlandANDERSON,MARK E, MD In 4 weeks.   Specialty:  Obstetrics and Gynecology   Contact information:   [address] [place] [phone]       Newborn Data: Live born female  Birth Weight: 5 lb 4.8 oz (2404 g) APGAR: 8, 9  Baby in NICU at time of mother's discharge.  Philip AspenSidney Zaira Iacovelli 09/30/2013, 10:37 AM
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
## 2013-10-01 ENCOUNTER — Encounter (HOSPITAL_COMMUNITY): Admission: RE | Payer: Self-pay | Source: Ambulatory Visit

## 2013-10-01 ENCOUNTER — Inpatient Hospital Stay (HOSPITAL_COMMUNITY)
Admission: RE | Admit: 2013-10-01 | Payer: Medicaid Other | Source: Ambulatory Visit | Admitting: Obstetrics & Gynecology

## 2013-10-01 SURGERY — Surgical Case
Anesthesia: Regional

## 2014-03-29 ENCOUNTER — Encounter (HOSPITAL_COMMUNITY): Payer: Self-pay | Admitting: *Deleted

## 2014-06-24 IMAGING — US US OB COMP +14 WK
1 series · 12 of 28 positions shown · non-contrast
Comparison: none

[Series 1: us ob comp +14 wk · 0.25mm/px · 62 acquisitions, 12 frames shown]
[im 3/62]
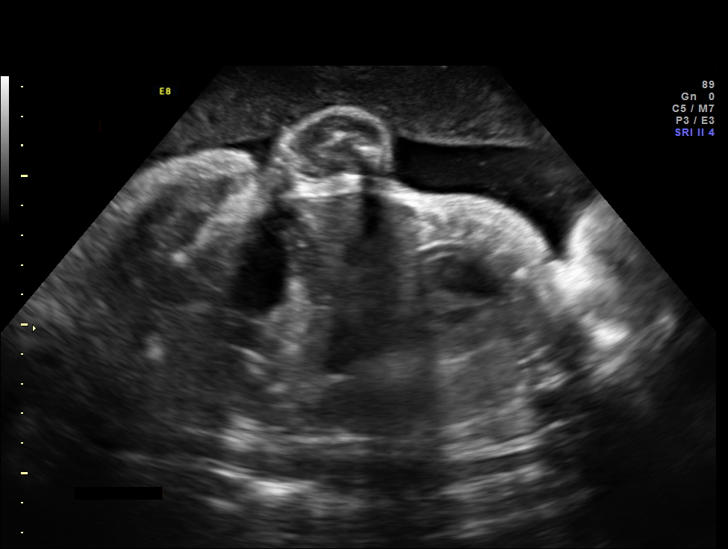
[im 7/62]
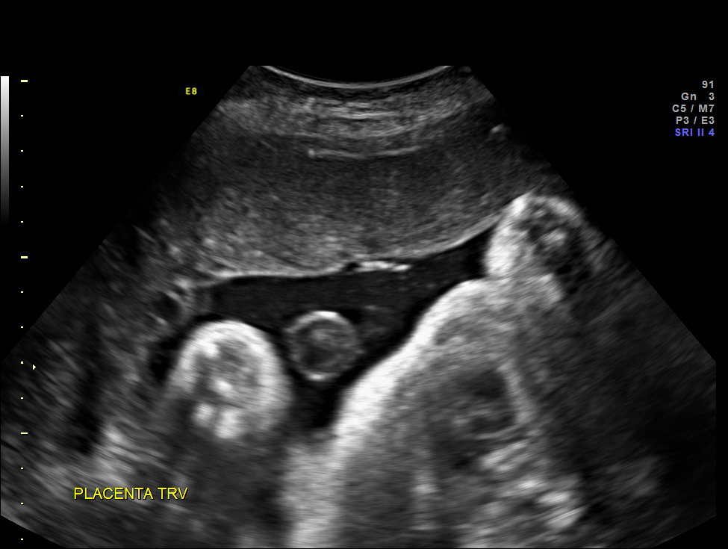
[im 12/62]
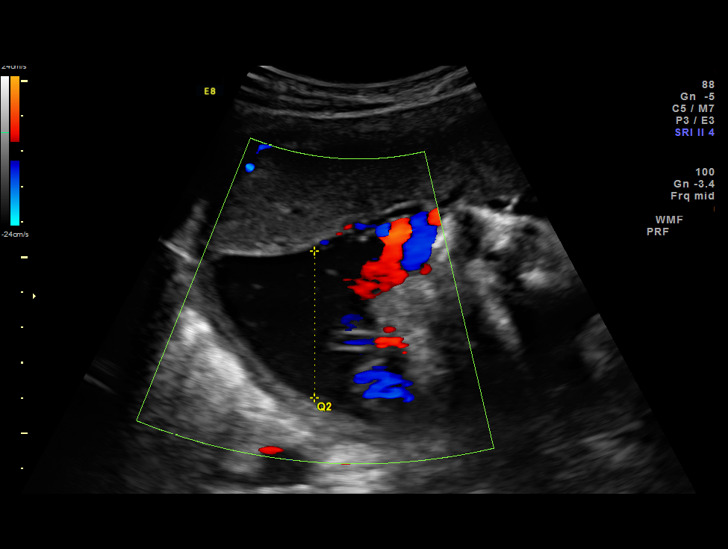
[im 19/62]
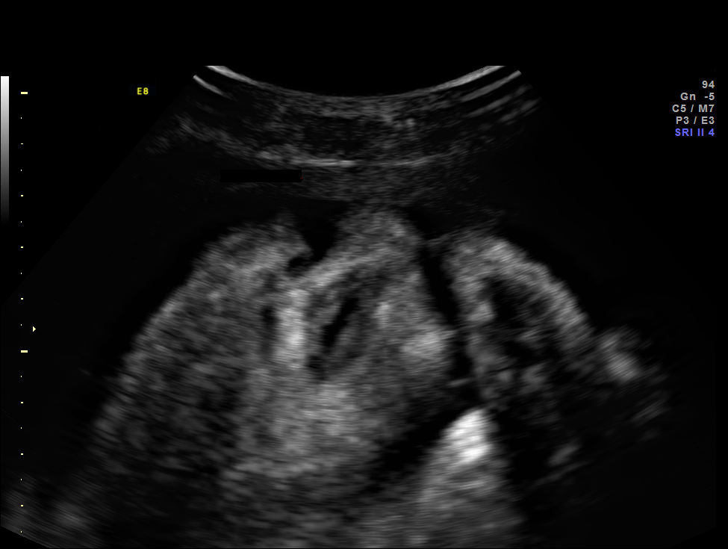
[im 23/62]
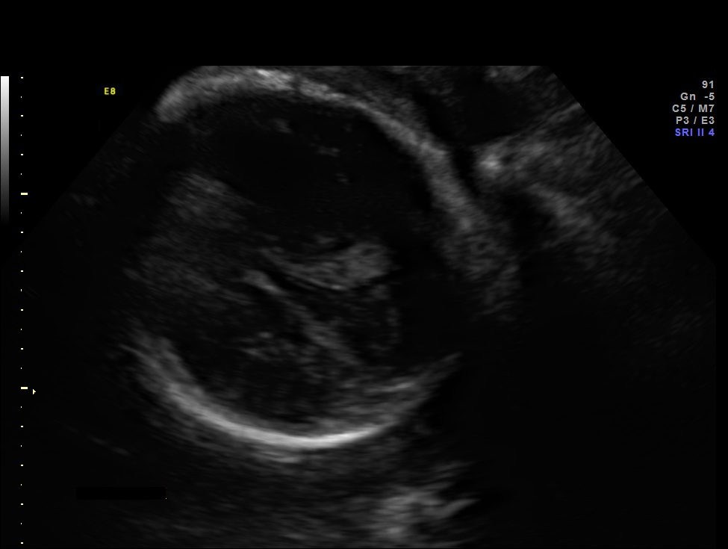
[im 28/62]
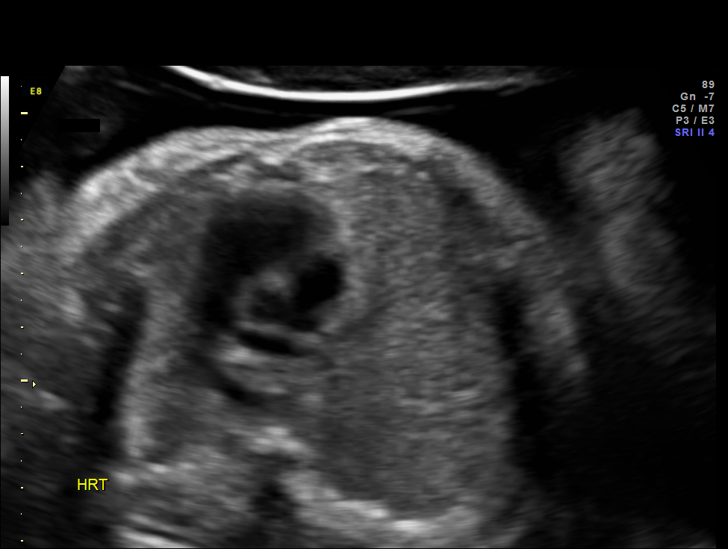
[im 34/62]
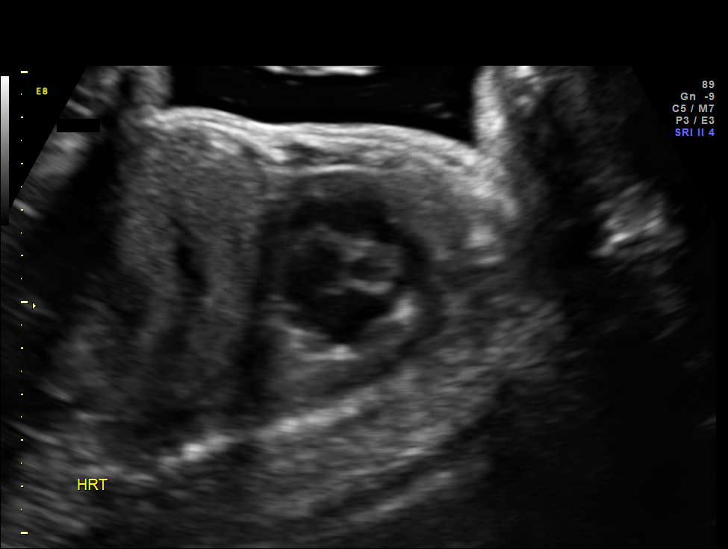
[im 39/62]
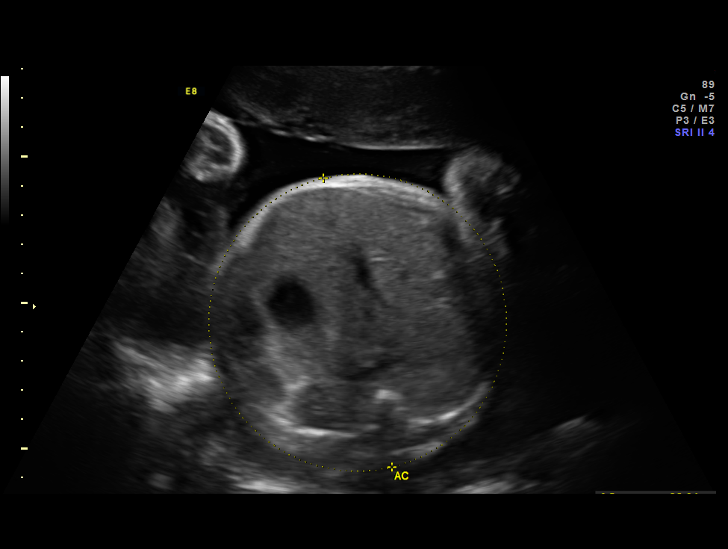
[im 43/62]
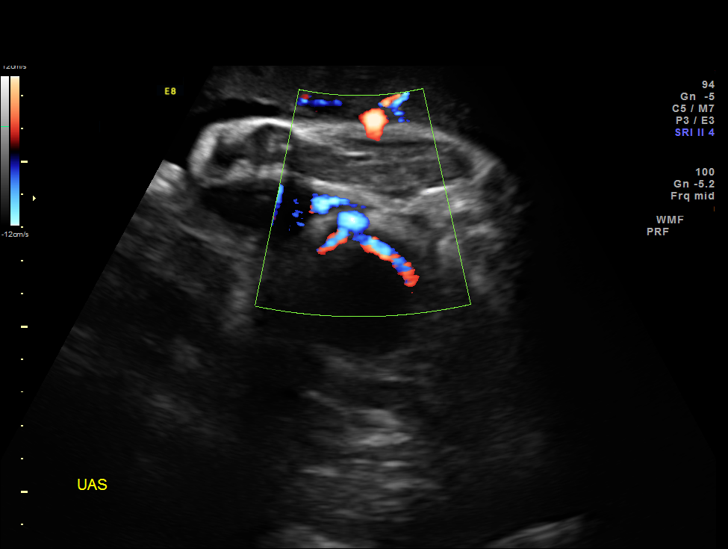
[im 50/62]
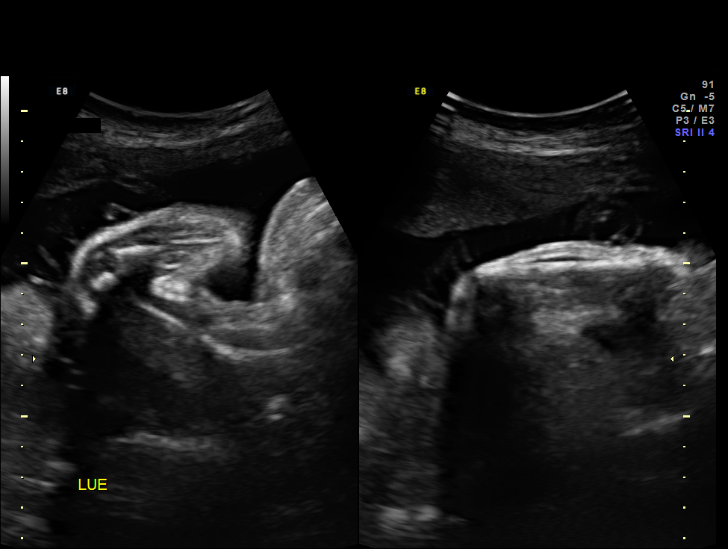
[im 55/62]
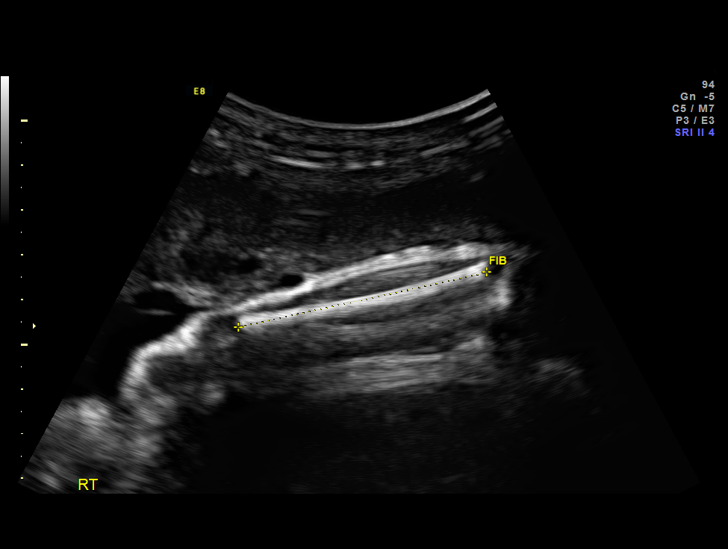
[im 59/62]
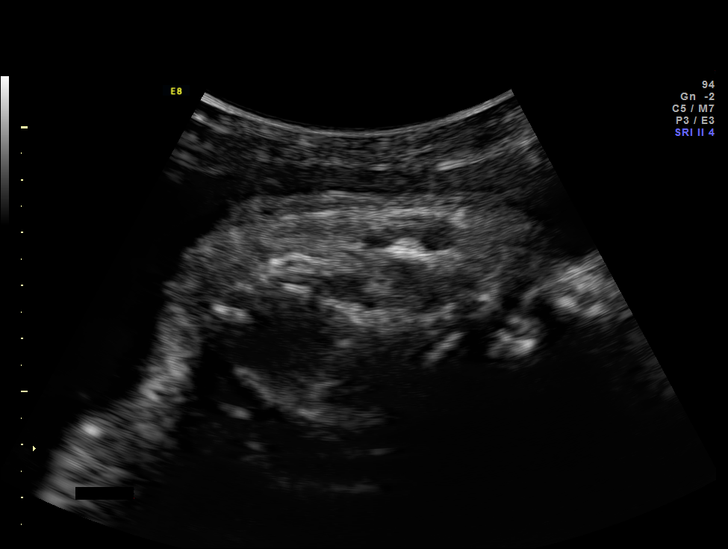

[12 of 28 positions shown; findings below may reference images not displayed]

OBSTETRICS REPORT
                      (Signed Final 09/17/2013 [DATE])

Service(s) Provided

 US OB COMP + 14 WK                                    76805.1
Indications

 Basic anatomic survey
 Size less than dates (Small for gestational [AGE]
 FGR)
 No or Little Prenatal Care
 Advanced maternal age (AMA), Multigravida
 History of cesarean delivery, currently pregnant      654.20,
 Cigarette smoker
Fetal Evaluation

 Num Of Fetuses:    1
 Fetal Heart Rate:  130                          bpm
 Cardiac Activity:  Observed
 Presentation:      Cephalic
 Placenta:          Anterior, above cervical os

 Amniotic Fluid
 AFI FV:      Subjectively within normal limits
 AFI Sum:     13.43   cm       50  %Tile     Larg Pckt:    4.17  cm
 RUQ:   4.12    cm   RLQ:    1.97   cm    LUQ:   4.17    cm   LLQ:    3.17   cm
Biometry

 BPD:     90.6  mm     G. Age:  36w 5d                CI:         82.4   70 - 86
 OFD:      110  mm                                    FL/HC:      20.6   20.8 -

 HC:     320.7  mm     G. Age:  36w 1d        8  %    HC/AC:      1.00   0.92 -

 AC:     319.7  mm     G. Age:  35w 6d       23  %    FL/BPD:     72.8   71 - 87
 FL:        66  mm     G. Age:  34w 0d      < 3  %    FL/AC:      20.6   20 - 24
 HUM:       57  mm     G. Age:  33w 1d      < 5  %
 ULN:     55.2  mm     G. Age:  34w 5d       14  %
 TIB:     59.2  mm     G. Age:  34w 4d       20  %
 RAD:     47.9  mm     G. Age:  34w 6d       38  %
 FIB:     56.8  mm     G. Age:  34w 6d       58  %

 Est. FW:    6404  gm    5 lb 15 oz      28  %
Gestational Age
 LMP:           37w 3d        Date:  12/29/12                 EDD:   10/05/13
 U/S Today:     35w 5d                                        EDD:   10/17/13
 Best:          37w 3d     Det. By:  LMP  (12/29/12)          EDD:   10/05/13
Anatomy

 Cranium:          Appears normal         Aortic Arch:      Appears normal
 Fetal Cavum:      Not well visualized    Ductal Arch:      Appears normal
 Ventricles:       Appears normal         Diaphragm:        Appears normal
 Choroid Plexus:   Appears normal         Stomach:          Appears normal
 Cerebellum:       Not well visualized    Abdomen:          Appears normal
 Posterior Fossa:  Not well visualized    Abdominal Wall:   Appears nml (cord
                                                            insert, abd wall)
 Nuchal Fold:      Not applicable (>20    Cord Vessels:     Appears normal (3
                   wks GA)                                  vessel cord)
 Face:             Appears normal         Kidneys:          Appear normal
                   (orbits and profile)
 Lips:             Appears normal         Bladder:          Appears normal
 Heart:            Appears normal         Spine:            Not well visualized
                   (4CH, axis, and
                   situs)
 RVOT:             Appears normal         Lower             Visualized
                                          Extremities:
 LVOT:             Appears normal         Upper             Visualized
                                          Extremities:

 Other:  Fetus appears to be a female. Nasal bone visualized. Technically
         difficult due to advanced GA and fetal position.
Cervix Uterus Adnexa

 Cervix:       Not visualized (advanced GA >43wks)

 Left Ovary:    Not visualized.
 Right Ovary:   Within normal limits.

 Adnexa:     No abnormality visualized.
Comments

 The patient's fetal anatomic survey is not complete due
 advanced gestational age.  However, no gross fetal
 anomalies were identified.

 Ms. Moskva was referred to the [facility] due to concern for fetal
 growth restriction.  On ultrasound today her fetus is
 measuring within the normal range.
Impression

 Single living intrauterine pregnancy at 37 weeks 3 days.
 Appropriate fetal growth (28%).
 Normal amniotic fluid volume.
 No gross fetal anomalies identified.
Recommendations

 Follow-up ultrasounds as clinically indicated.
 questions or concerns.
                Laura, Audel

## 2019-03-03 ENCOUNTER — Other Ambulatory Visit: Payer: Self-pay

## 2019-03-03 DIAGNOSIS — Z20822 Contact with and (suspected) exposure to covid-19: Secondary | ICD-10-CM

## 2019-03-05 ENCOUNTER — Telehealth: Payer: Self-pay | Admitting: General Practice

## 2019-03-05 LAB — NOVEL CORONAVIRUS, NAA: SARS-CoV-2, NAA: NOT DETECTED

## 2019-03-05 NOTE — Telephone Encounter (Signed)
Negative COVID results given. Patient results "NOT Detected." Caller expressed understanding. ° °

## 2019-04-18 ENCOUNTER — Other Ambulatory Visit: Payer: Self-pay

## 2019-04-18 ENCOUNTER — Emergency Department (HOSPITAL_COMMUNITY)
Admission: EM | Admit: 2019-04-18 | Discharge: 2019-04-18 | Disposition: A | Discharge status: Home / Self Care | Payer: Self-pay | Attending: Emergency Medicine | Admitting: Emergency Medicine

## 2019-04-18 ENCOUNTER — Encounter (HOSPITAL_COMMUNITY): Payer: Self-pay | Admitting: Emergency Medicine

## 2019-04-18 DIAGNOSIS — M5431 Sciatica, right side: Secondary | ICD-10-CM | POA: Insufficient documentation

## 2019-04-18 DIAGNOSIS — F17210 Nicotine dependence, cigarettes, uncomplicated: Secondary | ICD-10-CM | POA: Insufficient documentation

## 2019-04-18 MED ORDER — MELOXICAM 15 MG PO TABS: 15.0000 mg | ORAL_TABLET | Freq: Every day | ORAL | 0 refills | Status: DC

## 2019-04-18 MED ORDER — HYDROCODONE-ACETAMINOPHEN 5-325 MG PO TABS: 1.0000 | ORAL_TABLET | Freq: Four times a day (QID) | ORAL | 0 refills | Status: AC | PRN

## 2019-04-18 MED ORDER — MELOXICAM 15 MG PO TABS: 15.0000 mg | ORAL_TABLET | Freq: Every day | ORAL | 0 refills | Status: AC

## 2019-04-18 MED ORDER — DEXAMETHASONE SODIUM PHOSPHATE 10 MG/ML IJ SOLN
10.0000 mg | Freq: Once | INTRAMUSCULAR | Status: AC
  Administered 2019-04-18: 10 mg via INTRAMUSCULAR
  Filled 2019-04-18: qty 1

## 2019-04-18 MED ORDER — HYDROCODONE-ACETAMINOPHEN 5-325 MG PO TABS: 1.0000 | ORAL_TABLET | Freq: Four times a day (QID) | ORAL | 0 refills | Status: DC | PRN

## 2019-04-18 MED ORDER — METHYLPREDNISOLONE 4 MG PO TBPK: ORAL_TABLET | ORAL | 0 refills | Status: AC

## 2019-04-18 MED ORDER — METHYLPREDNISOLONE 4 MG PO TBPK: ORAL_TABLET | ORAL | 0 refills | Status: DC

## 2019-04-18 MED ORDER — BACLOFEN 10 MG PO TABS: 10.0000 mg | ORAL_TABLET | Freq: Three times a day (TID) | ORAL | 0 refills | Status: DC

## 2019-04-18 MED ORDER — KETOROLAC TROMETHAMINE 60 MG/2ML IM SOLN
60.0000 mg | Freq: Once | INTRAMUSCULAR | Status: AC
  Administered 2019-04-18: 60 mg via INTRAMUSCULAR
  Filled 2019-04-18: qty 2

## 2019-04-18 MED ORDER — BACLOFEN 10 MG PO TABS: 10.0000 mg | ORAL_TABLET | Freq: Three times a day (TID) | ORAL | 0 refills | Status: AC
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
## 2019-04-18 NOTE — Discharge Instructions (Addendum)
SEEK IMMEDIATE MEDICAL ATTENTION IF: New numbness, tingling, weakness, or problem with the use of your arms or legs.  Severe back pain not relieved with medications.  Change in bowel or bladder control.  Increasing pain in any areas of the body (such as chest or abdominal pain).  Shortness of breath, dizziness or fainting.  Nausea (feeling sick to your stomach), vomiting, fever, or sweats.  

## 2019-04-18 NOTE — ED Triage Notes (Signed)
Pt c/o sciatic nerve pain down her right leg. Pt states she has had this for years, but only flares up occasionally. Was seen at [facility] & was given steroids and muscle relaxer.

## 2020-02-15 ENCOUNTER — Other Ambulatory Visit: Payer: Self-pay

## 2020-02-15 ENCOUNTER — Encounter (HOSPITAL_COMMUNITY): Payer: Self-pay | Admitting: Emergency Medicine

## 2020-02-15 ENCOUNTER — Emergency Department (HOSPITAL_COMMUNITY)
Admission: EM | Admit: 2020-02-15 | Discharge: 2020-02-15 | Disposition: A | Discharge status: Home / Self Care | Payer: Self-pay | Attending: Emergency Medicine | Admitting: Emergency Medicine

## 2020-02-15 DIAGNOSIS — R110 Nausea: Secondary | ICD-10-CM | POA: Insufficient documentation

## 2020-02-15 DIAGNOSIS — K4090 Unilateral inguinal hernia, without obstruction or gangrene, not specified as recurrent: Secondary | ICD-10-CM | POA: Insufficient documentation

## 2020-02-15 DIAGNOSIS — Z5321 Procedure and treatment not carried out due to patient leaving prior to being seen by health care provider: Secondary | ICD-10-CM | POA: Insufficient documentation

## 2020-02-15 NOTE — ED Triage Notes (Signed)
Patient here with inguinal hernia, it is hard as a brick per patient and making her nauseated.  She states that the pain usually goes away after 24 hours but has not at this point.

## 2020-02-15 NOTE — ED Notes (Signed)
Pt called for Triage no answer.

## 2021-02-28 DIAGNOSIS — H40033 Anatomical narrow angle, bilateral: Secondary | ICD-10-CM | POA: Diagnosis not present

## 2021-02-28 DIAGNOSIS — H5213 Myopia, bilateral: Secondary | ICD-10-CM | POA: Diagnosis not present

## 2021-02-28 DIAGNOSIS — H04123 Dry eye syndrome of bilateral lacrimal glands: Secondary | ICD-10-CM | POA: Diagnosis not present

## 2023-04-18 DIAGNOSIS — R300 Dysuria: Secondary | ICD-10-CM | POA: Diagnosis not present
